# Patient Record
Sex: Female | Born: 1990 | Race: White | Hispanic: No | Marital: Single | Smoking: Former smoker
Health system: Southern US, Community
[De-identification: ages and names within clinical notes are randomized; demographics above are authoritative.]

## PROBLEM LIST (undated history)

## (undated) DIAGNOSIS — K589 Irritable bowel syndrome without diarrhea: Secondary | ICD-10-CM

## (undated) DIAGNOSIS — G43909 Migraine, unspecified, not intractable, without status migrainosus: Secondary | ICD-10-CM

## (undated) HISTORY — PX: LEG SURGERY: SHX1003

## (undated) HISTORY — PX: WISDOM TOOTH EXTRACTION: SHX21

---
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
## 2012-03-16 ENCOUNTER — Emergency Department (INDEPENDENT_AMBULATORY_CARE_PROVIDER_SITE_OTHER): Payer: No Typology Code available for payment source

## 2012-03-16 ENCOUNTER — Encounter (HOSPITAL_BASED_OUTPATIENT_CLINIC_OR_DEPARTMENT_OTHER): Payer: Self-pay | Admitting: Emergency Medicine

## 2012-03-16 ENCOUNTER — Emergency Department (HOSPITAL_BASED_OUTPATIENT_CLINIC_OR_DEPARTMENT_OTHER)
Admission: EM | Admit: 2012-03-16 | Discharge: 2012-03-16 | Disposition: A | Discharge status: Home / Self Care | Payer: No Typology Code available for payment source | Attending: Emergency Medicine | Admitting: Emergency Medicine

## 2012-03-16 DIAGNOSIS — S060X9A Concussion with loss of consciousness of unspecified duration, initial encounter: Secondary | ICD-10-CM | POA: Insufficient documentation

## 2012-03-16 DIAGNOSIS — S0990XA Unspecified injury of head, initial encounter: Secondary | ICD-10-CM

## 2012-03-16 DIAGNOSIS — M549 Dorsalgia, unspecified: Secondary | ICD-10-CM

## 2012-03-16 DIAGNOSIS — M542 Cervicalgia: Secondary | ICD-10-CM

## 2012-03-16 DIAGNOSIS — S161XXA Strain of muscle, fascia and tendon at neck level, initial encounter: Secondary | ICD-10-CM

## 2012-03-16 DIAGNOSIS — S239XXA Sprain of unspecified parts of thorax, initial encounter: Secondary | ICD-10-CM | POA: Insufficient documentation

## 2012-03-16 DIAGNOSIS — IMO0002 Reserved for concepts with insufficient information to code with codable children: Secondary | ICD-10-CM

## 2012-03-16 DIAGNOSIS — S139XXA Sprain of joints and ligaments of unspecified parts of neck, initial encounter: Secondary | ICD-10-CM | POA: Insufficient documentation

## 2012-03-16 DIAGNOSIS — S060XAA Concussion with loss of consciousness status unknown, initial encounter: Secondary | ICD-10-CM | POA: Insufficient documentation

## 2012-03-16 HISTORY — DX: Migraine, unspecified, not intractable, without status migrainosus: G43.909

## 2012-03-16 HISTORY — DX: Irritable bowel syndrome, unspecified: K58.9

## 2012-03-16 MED ORDER — IBUPROFEN 800 MG PO TABS
800.0000 mg | ORAL_TABLET | Freq: Once | ORAL | Status: AC
  Administered 2012-03-16: 800 mg via ORAL

## 2012-03-16 MED ORDER — IBUPROFEN 800 MG PO TABS
ORAL_TABLET | ORAL | Status: AC
  Administered 2012-03-16: 800 mg via ORAL
  Filled 2012-03-16: qty 1

## 2012-03-16 NOTE — Discharge Instructions (Signed)
Back Pain, Adult Low back pain is very common. About 1 in 5 people have back pain.The cause of low back pain is rarely dangerous. The pain often gets better over time.About half of people with a sudden onset of back pain feel better in just 2 weeks. About 8 in 10 people feel better by 6 weeks.  CAUSES Some common causes of back pain include:  Strain of the muscles or ligaments supporting the spine.   Wear and tear (degeneration) of the spinal discs.   Arthritis.   Direct injury to the back.  DIAGNOSIS Most of the time, the direct cause of low back pain is not known.However, back pain can be treated effectively even when the exact cause of the pain is unknown.Answering your caregiver's questions about your overall health and symptoms is one of the most accurate ways to make sure the cause of your pain is not dangerous. If your caregiver needs more information, he or she may order lab work or imaging tests (X-rays or MRIs).However, even if imaging tests show changes in your back, this usually does not require surgery. HOME CARE INSTRUCTIONS For many people, back pain returns.Since low back pain is rarely dangerous, it is often a condition that people can learn to manageon their own.   Remain active. It is stressful on the back to sit or stand in one place. Do not sit, drive, or stand in one place for more than 30 minutes at a time. Take short walks on level surfaces as soon as pain allows.Try to increase the length of time you walk each day.   Do not stay in bed.Resting more than 1 or 2 days can delay your recovery.   Do not avoid exercise or work.Your body is made to move.It is not dangerous to be active, even though your back may hurt.Your back will likely heal faster if you return to being active before your pain is gone.   Pay attention to your body when you bend and lift. Many people have less discomfortwhen lifting if they bend their knees, keep the load close to their  bodies,and avoid twisting. Often, the most comfortable positions are those that put less stress on your recovering back.   Find a comfortable position to sleep. Use a firm mattress and lie on your side with your knees slightly bent. If you lie on your back, put a pillow under your knees.   Only take over-the-counter or prescription medicines as directed by your caregiver. Over-the-counter medicines to reduce pain and inflammation are often the most helpful.Your caregiver may prescribe muscle relaxant drugs.These medicines help dull your pain so you can more quickly return to your normal activities and healthy exercise.   Put ice on the injured area.   Put ice in a plastic bag.   Place a towel between your skin and the bag.   Leave the ice on for 15 to 20 minutes, 3 to 4 times a day for the first 2 to 3 days. After that, ice and heat may be alternated to reduce pain and spasms.   Ask your caregiver about trying back exercises and gentle massage. This may be of some benefit.   Avoid feeling anxious or stressed.Stress increases muscle tension and can worsen back pain.It is important to recognize when you are anxious or stressed and learn ways to manage it.Exercise is a great option.  SEEK MEDICAL CARE IF:  You have pain that is not relieved with rest or medicine.   You have   pain that does not improve in 1 week.   You have new symptoms.   You are generally not feeling well.  SEEK IMMEDIATE MEDICAL CARE IF:   You have pain that radiates from your back into your legs.   You develop new bowel or bladder control problems.   You have unusual weakness or numbness in your arms or legs.   You develop nausea or vomiting.   You develop abdominal pain.   You feel faint.  Document Released: 11/27/2005 Document Revised: 11/16/2011 Document Reviewed: 04/17/2011 [facility] Patient Information 2012 [place], [place].Concussion and Brain Injury A blow or jolt to the head can disrupt the normal  function of the brain. This type of brain injury is often called a "concussion" or a "closed head injury." Concussions are usually not life-threatening. Even so, the effects of a concussion can be serious.  CAUSES  A concussion is caused by a blunt blow to the head. The blow might be direct or indirect as described below.  Direct blow (running into another player during a soccer game, being hit in a fight, or hitting your head on a hard surface).   Indirect blow (when your head moves rapidly and violently back and forth like in a car crash).  SYMPTOMS  The brain is very complex. Every head injury is different. Some symptoms may appear right away. Other symptoms may not show up for days or weeks after the concussion. The signs of concussion can be hard to notice. Early on, problems may be missed by patients, family members, and caregivers. You may look fine even though you are acting or feeling differently.  These symptoms are usually temporary, but may last for days, weeks, or even longer. Symptoms include:  Mild headaches that will not go away.   Having more trouble than usual with:   Remembering things.   Paying attention or concentrating.   Organizing daily tasks.   Making decisions and solving problems.   Slowness in thinking, acting, speaking, or reading.   Getting lost or easily confused.   Feeling tired all the time or lacking energy (fatigue).   Feeling drowsy.   Sleep disturbances.   Sleeping more than usual.   Sleeping less than usual.   Trouble falling asleep.   Trouble sleeping (insomnia).   Loss of balance or feeling lightheaded or dizzy.   Nausea or vomiting.   Numbness or tingling.   Increased sensitivity to:   Sounds.   Lights.   Distractions.  Other symptoms might include:  Vision problems or eyes that tire easily.   Diminished sense of taste or smell.   Ringing in the ears.   Mood changes such as feeling sad, anxious, or listless.    Becoming easily irritated or angry for little or no reason.   Lack of motivation.  DIAGNOSIS  Your caregiver can usually diagnose a concussion or mild brain injury based on your description of your injury and your symptoms.  Your evaluation might include:  A brain scan to look for signs of injury to the brain. Even if the test shows no injury, you may still have a concussion.   Blood tests to be sure other problems are not present.  TREATMENT   People with a concussion need to be examined and evaluated. Most people with concussions are treated in an emergency department, urgent care, or clinic. Some people must stay in the hospital overnight for further treatment.   Your caregiver will send you home with important instructions to follow.  Be sure to carefully follow them.   Tell your caregiver if you are already taking any medicines (prescription, over-the-counter, or natural remedies), or if you are drinking alcohol or taking illegal drugs. Also, talk with your caregiver if you are taking blood thinners (anticoagulants) or aspirin. These drugs may increase your chances of complications. All of this is important information that may affect treatment.   Only take over-the-counter or prescription medicines for pain, discomfort, or fever as directed by your caregiver.  PROGNOSIS  How fast people recover from brain injury varies from person to person. Although most people have a good recovery, how quickly they improve depends on many factors. These factors include how severe their concussion was, what part of the brain was injured, their age, and how healthy they were before the concussion.  Because all head injuries are different, so is recovery. Most people with mild injuries recover fully. Recovery can take time. In general, recovery is slower in older persons. Also, persons who have had a concussion in the past or have other medical problems may find that it takes longer to recover from their  current injury. Anxiety and depression may also make it harder to adjust to the symptoms of brain injury. HOME CARE INSTRUCTIONS  Return to your normal activities slowly, not all at once. You must give your body and brain enough time for recovery.  Get plenty of sleep at night, and rest during the day. Rest helps the brain to heal.   Avoid staying up late at night.   Keep the same bedtime hours on weekends and weekdays.   Take daytime naps or rest breaks when you feel tired.   Limit activities that require a lot of thought or concentration (brain or cognitive rest). This includes:   Homework or job-related work.   Watching TV.   Computer work.   Avoid activities that could lead to a second brain injury, such as contact or recreational sports, until your caregiver says it is okay. Even after your brain injury has healed, you should protect yourself from having another concussion.   Ask your caregiver when you can return to your normal activities such as driving, bicycling, or operating heavy equipment. Your ability to react may be slower after a brain injury.   Talk with your caregiver about when you can return to work or school.   Inform your teachers, school nurse, school counselor, coach, Event organiser, or work Production designer, theatre/television/film about your injury, symptoms, and restrictions. They should be instructed to report:   Increased problems with attention or concentration.   Increased problems remembering or learning new information.   Increased time needed to complete tasks or assignments.   Increased irritability or decreased ability to cope with stress.   Increased symptoms.   Take only those medicines that your caregiver has approved.   Do not drink alcohol until your caregiver says you are well enough to do so. Alcohol and certain other drugs may slow your recovery and can put you at risk of further injury.   If it is harder than usual to remember things, write them down.   If you  are easily distracted, try to do one thing at a time. For example, do not try to watch TV while fixing dinner.   Talk with family members or close friends when making important decisions.   Keep all follow-up appointments. Repeated evaluation of your symptoms is recommended for your recovery.  PREVENTION  Protect your head from future injury. It is  very important to avoid another head or brain injury before you have recovered. In rare cases, another injury has lead to permanent brain damage, brain swelling, or death. Avoid injuries by using:  Seatbelts when riding in a car.   Alcohol only in moderation.   A helmet when biking, skiing, skateboarding, skating, or doing similar activities.   Safety measures in your home.   Remove clutter and tripping hazards from floors and stairways.   Use grab bars in bathrooms and handrails by stairs.   Place non-slip mats on floors and in bathtubs.   Improve lighting in dim areas.  SEEK MEDICAL CARE IF:  A head injury can cause lingering symptoms. You should seek medical care if you have any of the following symptoms for more than 3 weeks after your injury or are planning to return to sports:  Chronic headaches.   Dizziness or balance problems.   Nausea.   Vision problems.   Increased sensitivity to noise or light.   Depression or mood swings.   Anxiety or irritability.   Memory problems.   Difficulty concentrating or paying attention.   Sleep problems.   Feeling tired all the time.  SEEK IMMEDIATE MEDICAL CARE IF:  You have had a blow or jolt to the head and you (or your family or friends) notice:  Severe or worsening headaches.   Weakness (even if only in one hand or one leg or one part of the face), numbness, or decreased coordination.   Repeated vomiting.   Increased sleepiness or passing out.   One black center of the eye (pupil) is larger than the other.   Convulsions (seizures).   Slurred speech.   Increasing  confusion, restlessness, agitation, or irritability.   Lack of ability to recognize people or places.   Neck pain.   Difficulty being awakened.   Unusual behavior changes.   Loss of consciousness.  Older adults with a brain injury may have a higher risk of serious complications such as a blood clot on the brain. Headaches that get worse or an increase in confusion are signs of this complication. If these signs occur, see a caregiver right away. MAKE SURE YOU:   Understand these instructions.   Will watch your condition.   Will get help right away if you are not doing well or get worse.  FOR MORE INFORMATION  Several groups help people with brain injury and their families. They provide information and put people in touch with local resources. These include support groups, rehabilitation services, and a variety of health care professionals. Among these groups, the Brain Injury Association (BIA, www.biausa.org) has a Secretary/administrator that gathers scientific and educational information and works on a national level to help people with brain injury.  Document Released: 02/17/2004 Document Revised: 11/16/2011 Document Reviewed: 07/15/2008 [facility] Patient Information 2012 [place], [place].

## 2012-03-16 NOTE — ED Provider Notes (Signed)
History     CSN: [id number]  Arrival date & time 03/16/12  1222   First MD Initiated Contact with Patient 03/16/12 1318      Chief Complaint  Patient presents with  . Fall  . Head Injury  . Neck Pain  . Back Pain    (Consider location/radiation/quality/duration/timing/severity/associated sxs/prior treatment) Patient is a 21 y.o. female presenting with fall. The history is provided by the patient and a parent.  Fall The accident occurred 1 to 2 hours ago. Incident: While riding a horse. She landed on dirt. There was no blood loss. The point of impact was the head. The pain is present in the head (Neck and upper back). The pain is moderate. She was not ambulatory at the scene. There was no entrapment after the fall. There was no drug use involved in the accident. There was no alcohol use involved in the accident. Associated symptoms include loss of consciousness (possibly). Pertinent negatives include no nausea.   patient does feel like her hands and legs are tingling however she states she can feel everything when touched.  Past Medical History  Diagnosis Date  . IBS (irritable bowel syndrome)   . Migraines     Past Surgical History  Procedure Date  . Leg surgery     cut the bands in legs for increased movement.    History reviewed. No pertinent family history.  History  Substance Use Topics  . Smoking status: Not on file  . Smokeless tobacco: Not on file  . Alcohol Use:     OB History    Grav Para Term Preterm Abortions TAB SAB Ect Mult Living                  Review of Systems  Gastrointestinal: Negative for nausea.  Neurological: Positive for loss of consciousness (possibly).    Allergies  Cinnamon; Doxycycline; Latex; and Sulfa antibiotics  Home Medications   Current Outpatient Rx  Name Route Sig Dispense Refill  . SEASONIQUE PO Oral Take by mouth.    . SERTRALINE HCL 100 MG PO TABS Oral Take 100 mg by mouth daily.    . TOPIRAMATE 100 MG PO TABS Oral  Take 100 mg by mouth 2 (two) times daily.      BP 122/67  Pulse 77  Temp(Src) 99 F (37.2 C) (Oral)  Resp 16  Ht 5\' 3"  (1.6 m)  Wt 180 lb (81.647 kg)  BMI 31.89 kg/m2  SpO2 100%  LMP 01/17/2012  Physical Exam  Nursing note and vitals reviewed. Constitutional: She appears well-developed and well-nourished. No distress.  HENT:  Head: Normocephalic and atraumatic.  Right Ear: External ear normal.  Left Ear: External ear normal.  Eyes: Conjunctivae are normal. Right eye exhibits no discharge. Left eye exhibits no discharge. No scleral icterus.  Neck: Neck supple. No tracheal deviation present.  Cardiovascular: Normal rate, regular rhythm and intact distal pulses.   Pulmonary/Chest: Effort normal and breath sounds normal. No stridor. No respiratory distress. She has no wheezes. She has no rales.  Abdominal: Soft. Bowel sounds are normal. She exhibits no distension. There is no tenderness. There is no rebound and no guarding.  Musculoskeletal: She exhibits no edema and no tenderness.       Cervical back: She exhibits decreased range of motion and tenderness. She exhibits no swelling and no edema.       Thoracic back: She exhibits tenderness and bony tenderness. She exhibits no swelling, no edema and no deformity.  Lumbar back: Normal.  Neurological: She is alert. She has normal strength. No sensory deficit. Cranial nerve deficit:  no gross defecits noted. She exhibits normal muscle tone. She displays no seizure activity. Coordination normal.  Skin: Skin is warm and dry. No rash noted.       No contusions noted  Psychiatric: She has a normal mood and affect.    ED Course  Procedures (including critical care time)  Labs Reviewed - No data to display Dg Thoracic Spine 2 View  03/16/2012  *RADIOLOGY REPORT*  Clinical Data: Fall, head injury, back pain  THORACIC SPINE - 2 VIEW  Comparison: None.  Findings: Three views of thoracic spine submitted.  No acute fracture or subluxation.   Alignment, disc spaces and vertebral height are preserved.  IMPRESSION: No acute fracture or subluxation.  Original Report Authenticated By: Natasha Mead, M.D.   Ct Head Wo Contrast  03/16/2012  *RADIOLOGY REPORT*  Clinical Data:  Fall from horse, neck pain  CT HEAD WITHOUT CONTRAST CT CERVICAL SPINE WITHOUT CONTRAST  Technique:  Multidetector CT imaging of the head and cervical spine was performed following the standard protocol without intravenous contrast.  Multiplanar CT image reconstructions of the cervical spine were also generated.  Comparison:   None  CT HEAD  Findings: No skull fracture is noted.  Paranasal sinuses and mastoid air cells are unremarkable.  No intracranial hemorrhage, mass effect or midline shift.  No acute infarction.  No mass lesion is noted on this unenhanced scan.  No hydrocephalus.  The gray and white matter differentiation is preserved.  IMPRESSION: No acute intracranial abnormality.  CT CERVICAL SPINE  Findings: Axial images of the cervical spine shows no acute fracture or subluxation.  There is no pneumothorax in visualized lung apices.  Computer processed images shows the alignment, disc spaces and vertebral height are preserved.  No prevertebral soft tissue swelling.  Cervical airway is patent.  Coronal images shows no acute fracture or subluxation.  IMPRESSION: No acute fracture or subluxation.  Original Report Authenticated By: Natasha Mead, M.D.   Ct Cervical Spine Wo Contrast  03/16/2012  *RADIOLOGY REPORT*  Clinical Data:  Fall from horse, neck pain  CT HEAD WITHOUT CONTRAST CT CERVICAL SPINE WITHOUT CONTRAST  Technique:  Multidetector CT imaging of the head and cervical spine was performed following the standard protocol without intravenous contrast.  Multiplanar CT image reconstructions of the cervical spine were also generated.  Comparison:   None  CT HEAD  Findings: No skull fracture is noted.  Paranasal sinuses and mastoid air cells are unremarkable.  No intracranial  hemorrhage, mass effect or midline shift.  No acute infarction.  No mass lesion is noted on this unenhanced scan.  No hydrocephalus.  The gray and white matter differentiation is preserved.  IMPRESSION: No acute intracranial abnormality.  CT CERVICAL SPINE  Findings: Axial images of the cervical spine shows no acute fracture or subluxation.  There is no pneumothorax in visualized lung apices.  Computer processed images shows the alignment, disc spaces and vertebral height are preserved.  No prevertebral soft tissue swelling.  Cervical airway is patent.  Coronal images shows no acute fracture or subluxation.  IMPRESSION: No acute fracture or subluxation.  Original Report Authenticated By: Natasha Mead, M.D.     1. Concussion   2. Cervical strain   3. Thoracic sprain and strain       MDM  Patient does not have any evidence of serious injuries based on her x-rays and exam. Suspect  she does have a mild concussion associated with the cervical and thoracic spine strain. Patient does not want any medications for pain. She states she does have some medications for pain well as some muscle relaxants at home that she can take as needed.        Celene Kras, MD 03/16/12 [phone]

## 2012-03-16 NOTE — ED Notes (Signed)
Pt fell off horse, fell onto upper back and neck.  Mother witnessed, stated she immediately went to pt and she spoke to her.  Pt states she felt like she blacked out for a second but was able to hear everything that was said.  Pt c/o pain in neck, upper back and lower back.  Able to move extremeties.  Pt states her legs feel numb but can feel sensation.

## 2013-05-19 ENCOUNTER — Ambulatory Visit (INDEPENDENT_AMBULATORY_CARE_PROVIDER_SITE_OTHER): Payer: BC Managed Care – PPO | Admitting: Sports Medicine

## 2013-05-19 VITALS — BP 126/85 | Ht 63.0 in | Wt 175.0 lb

## 2013-05-19 DIAGNOSIS — S43499A Other sprain of unspecified shoulder joint, initial encounter: Secondary | ICD-10-CM

## 2013-05-19 DIAGNOSIS — S46311A Strain of muscle, fascia and tendon of triceps, right arm, initial encounter: Secondary | ICD-10-CM

## 2013-05-19 DIAGNOSIS — M25521 Pain in right elbow: Secondary | ICD-10-CM

## 2013-05-19 DIAGNOSIS — M25529 Pain in unspecified elbow: Secondary | ICD-10-CM

## 2013-05-19 DIAGNOSIS — S46819A Strain of other muscles, fascia and tendons at shoulder and upper arm level, unspecified arm, initial encounter: Secondary | ICD-10-CM

## 2013-05-19 MED ORDER — NAPROXEN 500 MG PO TABS: ORAL_TABLET | ORAL | Status: AC

## 2013-05-20 NOTE — Progress Notes (Signed)
  Subjective:    Patient ID: Kellie Stephens, female    DOB: 1991-04-20, 22 y.o.   MRN: [id number]  HPI chief complaint: Right elbow pain  22 year old right-hand-dominant female comes in today complaining of one week of right elbow pain. She began to notice some discomfort in the posterior elbow after doing some triceps extensions with dumbbells. A few days later she felt a pop while showering. She felt like her elbow "dislocated". She has not noticed any swelling. She has not had problems with this elbow in the past. Her pain is most noticeable with elbow extension. No associated numbness or tingling. She is here today with her mother.  Medical history and medications are reviewed Surgical history is significant for surgery on both lower legs. This was done in October by Dr. Lajoyce Corners and it sounds like it was some sort of Achilles lengthening procedure. She is still out on medical leave from that surgery. She is allergic to latex, sulfa antibiotics, and doxycycline Does not smoke, no alcohol use    Review of Systems     Objective:   Physical Exam Well-developed, well-nourished. No acute distress. Awake alert and oriented x3. Vital signs are reviewed.  Right elbow: Full range of motion with pain at full extension. No joint effusion. No soft tissue swelling. There is tenderness to palpation at the insertion of the triceps tendon on the olecranon. No palpable defect. Reproducible pain with resisted triceps extension. No tenderness along the lateral or medial elbow. No tenderness over the biceps tendon. Strength is 5/5. Elbow is stable to ligamentous exam. Negative Tinel's over the cubital tunnel. No appreciable popping or catching in the elbow with active motion. Neurovascularly intact distally.  MSK ultrasound of the right elbow: Long and short views of the posterior elbow were obtained. There are hypoechoic changes at the insertion of the triceps tendon onto the olecranon. These changes are seen  both in long and short view. No obvious tear. No spurring. No neovascularization. Findings are consistent with tendon strain.       Assessment & Plan:  1. Right elbow pain secondary to triceps tendon strain, question snapping triceps syndrome  Naprosyn 500 mg twice a day with food for 5 days then when necessary. Ace wrap for compression and she can wean from this as symptoms allow. He will avoid any sort of exercise with triceps for the next 2-3 weeks. Otherwise, resume activity as tolerated and followup for ongoing or recalcitrant issues.

## 2013-12-09 ENCOUNTER — Ambulatory Visit (INDEPENDENT_AMBULATORY_CARE_PROVIDER_SITE_OTHER): Payer: BC Managed Care – PPO | Admitting: Neurology

## 2013-12-09 ENCOUNTER — Encounter: Payer: Self-pay | Admitting: Neurology

## 2013-12-09 ENCOUNTER — Telehealth: Payer: Self-pay | Admitting: Neurology

## 2013-12-09 VITALS — BP 102/70 | HR 72 | Temp 98.2°F | Ht 64.0 in | Wt 177.3 lb

## 2013-12-09 DIAGNOSIS — R51 Headache: Secondary | ICD-10-CM

## 2013-12-09 DIAGNOSIS — H539 Unspecified visual disturbance: Secondary | ICD-10-CM

## 2013-12-09 DIAGNOSIS — R519 Headache, unspecified: Secondary | ICD-10-CM

## 2013-12-09 MED ORDER — PROPRANOLOL HCL 40 MG PO TABS: 40.0000 mg | ORAL_TABLET | Freq: Two times a day (BID) | ORAL | Status: DC

## 2013-12-09 NOTE — Patient Instructions (Addendum)
For migraines. Migraine Recommendations: 1.  Take abortive medication at immediate onset of migraine (Advil) 2.  Limit use of pain relievers to no more than 2 to 3 days out of the week.  These medications include acetaminophen, ibuprofen, triptans and narcotics.  This will help reduce risk of rebound headaches. 3.  Keep a headache diary. 4.  Stay adequately hydrated. 5.  Maintain good sleep hygiene. 6.  Maintain proper stress management. 7.  For preventative therapy, we will start propranolol 40mg  twice daily (1 in morning and 1 at bedtime).  Side effects include low heart rate, lightheadedness and sleepiness.  Call in 4 weeks with update. 8.  We will get an MRI of the brain. 9.  Follow up in 3 months.

## 2013-12-09 NOTE — Progress Notes (Signed)
NEUROLOGY CONSULTATION NOTE  Kellie Stephens MRN: [id number] DOB: [birth year]  Referring provider: Dr. Azucena Cecil Primary care provider: Dr. Azucena Cecil  Reason for consult:  Headache  HISTORY OF PRESENT ILLNESS: Kellie Stephens is a 22 year old right-handed female with history of depression, anxiety and migraine who presents for migraines.  She is accompanied by her mother.  Records and images were personally reviewed where available.   Onset:  Off and on since age 79, but worse for the past year. Locatiion:  Bilateral retro-orbital.  Not positional. Quality:  nonthrobbing pressure Intensity:  8/10 Aura:  For 8 months, began having transient episodes of bilateral blurred vision lasting up to 30 minutes.  Occurs 1-2x a week.  It occurs separate from headache.  Went to eye doctor who said everything looks okay Associated symptoms:  Nausea, photophobia, phonophobia, osmophobia Duration:  1.5 to 2 hours Frequency:  Every other day Triggers/exacerbating factors:  perfume Relieving factors: sleep Activity:  Sleep.  Past abortive therapy:  Maxalt 10mg  (did not like taste), prochlorperazine  Past preventative therapy:  Amitriptyline (dose unknown; weight gain), verapamil (dose unknown; reduced frequency mildly), topiramate (dose unknown; reduced frequency to 2-3x/week, weight gain)  Current abortive therapy:  Advil (effective, takes about once a week), Aleve (not remembers if helpful) Current preventative therapy:  None She does not like taking medication and tries to push through the pain.  Caffeine:  One cup of coffee every other day Sleep hygiene:  Wakes up a couple of times but falls back to sleep.  Rested. Stress/depression:  No depression.  Mild stress but not significant Family history of headache:  Adopted.  No imaging.  PAST MEDICAL HISTORY: Past Medical History  Diagnosis Date  . IBS (irritable bowel syndrome)   . Migraines     PAST SURGICAL HISTORY: Past Surgical History    Procedure Laterality Date  . Leg surgery      cut the bands in legs for increased movement.  . Wisdom tooth extraction      MEDICATIONS: Current Outpatient Prescriptions on File Prior to Visit  Medication Sig Dispense Refill  . Levonorgest-Eth Estrad 91-Day (SEASONIQUE PO) Take by mouth.      . naproxen (NAPROSYN) 500 MG tablet Take 1 tablet by mouth twice daily for 5 days, then take as needed.  40 tablet  0   No current facility-administered medications on file prior to visit.    ALLERGIES: Allergies  Allergen Reactions  . Cinnamon   . Doxycycline   . Latex   . Sulfa Antibiotics     FAMILY HISTORY: Family History  Problem Relation Age of Onset  . Adopted: Yes    SOCIAL HISTORY: History   Social History  . Marital Status: Single    Spouse Name: N/A    Number of Children: N/A  . Years of Education: N/A   Occupational History  . Not on file.   Social History Main Topics  . Smoking status: Former Smoker    Quit date: 12/09/2012  . Smokeless tobacco: Not on file  . Alcohol Use: No  . Drug Use: No  . Sexual Activity: Not on file   Other Topics Concern  . Not on file   Social History Narrative  . No narrative on file    REVIEW OF SYSTEMS: Constitutional: No fevers, chills, or sweats, no generalized fatigue, change in appetite Eyes: No visual changes, double vision, eye pain Ear, nose and throat: No hearing loss, ear pain, nasal congestion, sore throat Cardiovascular: No  chest pain, palpitations Respiratory:  No shortness of breath at rest or with exertion, wheezes GastrointestinaI: No nausea, vomiting, diarrhea, abdominal pain, fecal incontinence Genitourinary:  No dysuria, urinary retention or frequency Musculoskeletal:  No neck pain, back pain Integumentary: No rash, pruritus, skin lesions Neurological: as above Psychiatric: No depression, insomnia, anxiety Endocrine: No palpitations, fatigue, diaphoresis, mood swings, change in appetite, change in  weight, increased thirst Hematologic/Lymphatic:  No anemia, purpura, petechiae. Allergic/Immunologic: no itchy/runny eyes, nasal congestion, recent allergic reactions, rashes  PHYSICAL EXAM: Filed Vitals:   12/09/13 1013  BP: 102/70  Pulse: 72  Temp: 98.2 F (36.8 C)   General: No acute distress Head:  Normocephalic/atraumatic Neck: supple, no paraspinal tenderness, full range of motion Back: No paraspinal tenderness Heart: regular rate and rhythm Lungs: Clear to auscultation bilaterally. Vascular: No carotid bruits. Neurological Exam: Mental status: alert and oriented to person, place, and time, speech fluent and not dysarthric, language intact. Cranial nerves: CN I: not tested CN II: pupils equal, round and reactive to light, visual fields intact, visual acuity 20/20 bilaterally, fundi unremarkable. CN III, IV, VI:  full range of motion, no nystagmus, no ptosis CN V: facial sensation intact CN VII: upper and lower face symmetric CN VIII: hearing intact CN IX, X: gag intact, uvula midline CN XI: sternocleidomastoid and trapezius muscles intact CN XII: tongue midline Bulk & Tone: normal, no fasciculations. Motor: 5/5 throughout Sensation: temperature and vibration intact Deep Tendon Reflexes: 2+ throughout, toes down Finger to nose testing: no dysmetria Heel to shin: no dysmetria Gait: normal stance and stride, able to walk on toes, heels and in tandem. Romberg negative.  IMPRESSION: 1.  Chronic migraine without aura. 2.  Transient blurred vision.  PLAN: 1.  We will start propranolol 40mg  BID for preventative.  Call in 4 weeks with update. 2.  Advil at earliest onset for migraine but not to exceed two days out of week. 3.  MRI of brain with and without contrast.  Transient blurred vision may well be a migraine aura without headache.  However, since this is a new symptom that started over the past several months, I think imaging of the brain is warranted. 4.  Follow up  in 3 months.   Thank you for allowing me to take part in the care of this patient.  Shon Millet, DO  CC:

## 2013-12-09 NOTE — Telephone Encounter (Signed)
Spoke with Dorene Grebe. Appointment for the MRI brain with and without is scheduled at [facility]) on Friday, January 2 at 8:45 am; arrive at 8:15 am. Address and phone number given as well.

## 2013-12-17 ENCOUNTER — Ambulatory Visit (HOSPITAL_COMMUNITY): Payer: BC Managed Care – PPO

## 2013-12-19 ENCOUNTER — Telehealth: Payer: Self-pay | Admitting: Neurology

## 2013-12-19 NOTE — Telephone Encounter (Signed)
Spoke with Dorene Grebeatalie and discussed the MRI results.  Looks okay.  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  She understands.  Contrast would have looked at this better, but they did not give her contrast due to her past severe reaction to sulfa.

## 2014-01-07 ENCOUNTER — Encounter: Payer: Self-pay | Admitting: Neurology

## 2014-03-09 ENCOUNTER — Ambulatory Visit: Payer: BC Managed Care – PPO | Admitting: Neurology

## 2014-03-09 ENCOUNTER — Encounter: Payer: Self-pay | Admitting: Neurology

## 2014-03-09 ENCOUNTER — Ambulatory Visit (INDEPENDENT_AMBULATORY_CARE_PROVIDER_SITE_OTHER): Payer: BC Managed Care – PPO | Admitting: Neurology

## 2014-03-09 VITALS — BP 118/64 | HR 68 | Temp 97.0°F | Resp 16 | Ht 64.0 in | Wt 212.5 lb

## 2014-03-09 DIAGNOSIS — G43709 Chronic migraine without aura, not intractable, without status migrainosus: Secondary | ICD-10-CM

## 2014-03-09 DIAGNOSIS — G43109 Migraine with aura, not intractable, without status migrainosus: Secondary | ICD-10-CM

## 2014-03-09 DIAGNOSIS — IMO0002 Reserved for concepts with insufficient information to code with codable children: Secondary | ICD-10-CM

## 2014-03-09 MED ORDER — PROPRANOLOL HCL 60 MG PO TABS: 60.0000 mg | ORAL_TABLET | Freq: Two times a day (BID) | ORAL | Status: DC

## 2014-03-09 NOTE — Progress Notes (Signed)
NEUROLOGY FOLLOW UP OFFICE NOTE  Kellie Stephens [id number]  HISTORY OF PRESENT ILLNESS: Kellie Stephens is a 23 year old right-handed female with history of depression, anxiety and migraine who follows up for migraines.  She is accompanied by her mother.  Records and images were personally reviewed where available.   UPDATE: 01/07/14 MRI BRAIN WO:  unremarkable.  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  Contrast was not given as she has past severe reaction to sulfa.  Intensity:  6/10 (one was 8/10) Duration:  1 to 2 hours Frequency:  15 headache days per month Current abortive therapy: Advil, Excedrin or Aleve (taken only 3 times in last 3 months) Current preventative therapy:  propranolol 40mg  twice daily  HISTORY Onset:  Off and on since age 75, but worse for the past year. Location:  Bilateral retro-orbital.  Not positional. Quality:  nonthrobbing pressure Intensity:  8/10 Aura:  For 8 months, began having transient episodes of bilateral blurred vision lasting up to 30 minutes, prior to the headache.  No visual obscurations.  Went to eye doctor who said everything looks okay Associated symptoms:  Nausea, photophobia, phonophobia, osmophobia Initial Duration:  1.5 to 2 hours Initial Frequency:  Every other day Triggers/exacerbating factors:  perfume Relieving factors: sleep Activity:  Sleep.  Past abortive therapy:  Maxalt 10mg  (did not like taste), prochlorperazine  Past preventative therapy:  Amitriptyline (dose unknown; weight gain), verapamil (dose unknown; reduced frequency mildly), topiramate (dose unknown; reduced frequency to 2-3x/week, weight gain)  Caffeine:  One cup of coffee every other day Sleep hygiene:  Wakes up a couple of times but falls back to sleep.  Rested. Stress/depression:  No depression.  Mild stress but not significant Family history of headache:  Adopted.  PAST MEDICAL HISTORY: Past Medical History  Diagnosis Date  . IBS  (irritable bowel syndrome)   . Migraines     MEDICATIONS: Current Outpatient Prescriptions on File Prior to Visit  Medication Sig Dispense Refill  . Levonorgest-Eth Estrad 91-Day (SEASONIQUE PO) Take by mouth.      . naproxen (NAPROSYN) 500 MG tablet Take 1 tablet by mouth twice daily for 5 days, then take as needed.  40 tablet  0   No current facility-administered medications on file prior to visit.    ALLERGIES: Allergies  Allergen Reactions  . Cinnamon   . Doxycycline   . Latex   . Sulfa Antibiotics     FAMILY HISTORY: Family History  Problem Relation Age of Onset  . Adopted: Yes  . Ataxia Neg Hx   . Chorea Neg Hx   . Dementia Neg Hx   . Mental retardation Neg Hx   . Migraines Neg Hx   . Multiple sclerosis Neg Hx   . Neurofibromatosis Neg Hx   . Neuropathy Neg Hx   . Parkinsonism Neg Hx   . Seizures Neg Hx   . Stroke Neg Hx     SOCIAL HISTORY: History   Social History  . Marital Status: Single    Spouse Name: N/A    Number of Children: N/A  . Years of Education: N/A   Occupational History  . Not on file.   Social History Main Topics  . Smoking status: Former Smoker    Quit date: 12/09/2012  . Smokeless tobacco: Not on file  . Alcohol Use: No  . Drug Use: No  . Sexual Activity: Not on file   Other Topics Concern  . Not on file   Social History Narrative  .  No narrative on file    REVIEW OF SYSTEMS: Constitutional: No fevers, chills, or sweats, no generalized fatigue, change in appetite Eyes: No visual changes, double vision, eye pain Ear, nose and throat: No hearing loss, ear pain, nasal congestion, sore throat Cardiovascular: No chest pain, palpitations Respiratory:  No shortness of breath at rest or with exertion, wheezes GastrointestinaI: No nausea, vomiting, diarrhea, abdominal pain, fecal incontinence Genitourinary:  No dysuria, urinary retention or frequency Musculoskeletal:  No neck pain, back pain Integumentary: No rash, pruritus,  skin lesions Neurological: as above Psychiatric: No depression, insomnia, anxiety Endocrine: No palpitations, fatigue, diaphoresis, mood swings, change in appetite, change in weight, increased thirst Hematologic/Lymphatic:  No anemia, purpura, petechiae. Allergic/Immunologic: no itchy/runny eyes, nasal congestion, recent allergic reactions, rashes  PHYSICAL EXAM: Filed Vitals:   03/09/14 1324  BP: 118/64  Pulse: 68  Temp: 97 F (36.1 C)  Resp: 16   General: No acute distress Head:  Normocephalic/atraumatic Neck: supple, no paraspinal tenderness, full range of motion Heart:  Regular rate and rhythm Lungs:  Clear to auscultation bilaterally Back: No paraspinal tenderness Neurological Exam: alert and oriented to person, place, and time. Attention span and concentration intact, recent and remote memory intact, fund of knowledge intact.  Speech fluent and not dysarthric, language intact.  CN II-XII intact. Fundoscopic exam unremarkable without vessel changes, exudates, hemorrhages or papilledema.  Bulk and tone normal, muscle strength 5/5 throughout.  Sensation to light touch, temperature and vibration intact.  Deep tendon reflexes 2+ throughout, toes downgoing.  Finger to nose and heel to shin testing intact.  Gait normal, Romberg negative.  IMPRESSION: Chronic migraine with aura.  No evidence for idiopathic intracranial hypertension on my exam  PLAN: 1.  Increase propranolol to 60mg  twice daily.  Call in 4 weeks with update. 2.  Offered to prescribe another triptan, but she refuses.  Has a headache now.  Offered to give her a toradol shot, but she refuses. 3.  Follow up in 2 months.  If propranolol ineffective, will likely suggest Botox  Shon MilletAdam Jaffe, DO  CC:  Merri Brunetteandace Smith, MD

## 2014-03-09 NOTE — Patient Instructions (Signed)
1.  Wrote new prescription for propranolol  Wrote for 60mg  pills.  Take 1 pill twice daily.  Call in 4 weeks and we may increase dose further. 2.  Follow up in 2 months.

## 2014-05-11 ENCOUNTER — Encounter: Payer: Self-pay | Admitting: Neurology

## 2014-05-11 ENCOUNTER — Ambulatory Visit (INDEPENDENT_AMBULATORY_CARE_PROVIDER_SITE_OTHER): Payer: BC Managed Care – PPO | Admitting: Neurology

## 2014-05-11 VITALS — BP 104/60 | HR 68 | Temp 98.4°F | Resp 16 | Ht 64.0 in | Wt 218.6 lb

## 2014-05-11 DIAGNOSIS — G43119 Migraine with aura, intractable, without status migrainosus: Secondary | ICD-10-CM

## 2014-05-11 MED ORDER — SUMATRIPTAN SUCCINATE 100 MG PO TABS: 100.0000 mg | ORAL_TABLET | Freq: Once | ORAL | Status: AC | PRN

## 2014-05-11 MED ORDER — PROPRANOLOL HCL 80 MG PO TABS: 80.0000 mg | ORAL_TABLET | Freq: Two times a day (BID) | ORAL | Status: DC

## 2014-05-11 NOTE — Progress Notes (Signed)
NEUROLOGY FOLLOW UP OFFICE NOTE  Kellie Meuseatalie Gosa [id number]  HISTORY OF PRESENT ILLNESS: Kellie Stephens is a 23 year old right-handed female with history of depression, anxiety and migraine who follows up for migraines.  She is accompanied by her friend.  Records and images were personally reviewed where available.   UPDATE: Intensity:  6/10 Duration:  2-3 hours Frequency:  15 headache days per month Current abortive therapy: Exedrin migraine (takes about 8 days per month) Current preventative therapy:  propranolol 60mg  twice daily  Caffeine:  No  Sleep hygiene:  Wakes up a couple of times but falls back to sleep.  Rested. Stress/depression:  No depression.  Mild stress but not significant Diet:  Tries to eat healthy Exercise:  Walks once a week  HISTORY Onset:  Off and on since age 89, but worse for the past year. Location:  Bilateral retro-orbital.  Not positional. Quality:  nonthrobbing pressure Initial intensity:  8/10; March 2015 6/10 Aura:  For 8 months, began having transient episodes of bilateral blurred vision lasting up to 30 minutes, prior to the headache.  No visual obscurations.  Went to eye doctor who said everything looks okay Associated symptoms:  Nausea, photophobia, phonophobia, osmophobia Initial Duration:  1.5 to 2 hours; March 2015 1 to 2 hours Initial Frequency:  Every other day; March 2015 15 headache days per month. Triggers/exacerbating factors:  perfume Relieving factors: sleep Activity:  Sleep.  Past abortive therapy:  Maxalt 10mg  (did not like taste), prochlorperazine   Past preventative therapy:  Amitriptyline (dose unknown; weight gain), verapamil (dose unknown; reduced frequency mildly), topiramate (dose unknown; reduced frequency to 2-3x/week, weight gain)  01/07/14 MRI BRAIN WO:  unremarkable.  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  Contrast was not given as she has past severe reaction to sulfa.  Family history of headache:   Adopted.  PAST MEDICAL HISTORY: Past Medical History  Diagnosis Date  . IBS (irritable bowel syndrome)   . Migraines     MEDICATIONS: Current Outpatient Prescriptions on File Prior to Visit  Medication Sig Dispense Refill  . Levonorgest-Eth Estrad 91-Day (SEASONIQUE PO) Take by mouth.      . naproxen (NAPROSYN) 500 MG tablet Take 1 tablet by mouth twice daily for 5 days, then take as needed.  40 tablet  0   No current facility-administered medications on file prior to visit.    ALLERGIES: Allergies  Allergen Reactions  . Cinnamon   . Doxycycline   . Latex   . Sulfa Antibiotics     FAMILY HISTORY: Family History  Problem Relation Age of Onset  . Adopted: Yes  . Ataxia Neg Hx   . Chorea Neg Hx   . Dementia Neg Hx   . Mental retardation Neg Hx   . Migraines Neg Hx   . Multiple sclerosis Neg Hx   . Neurofibromatosis Neg Hx   . Neuropathy Neg Hx   . Parkinsonism Neg Hx   . Seizures Neg Hx   . Stroke Neg Hx     SOCIAL HISTORY: History   Social History  . Marital Status: Single    Spouse Name: N/A    Number of Children: N/A  . Years of Education: N/A   Occupational History  . Not on file.   Social History Main Topics  . Smoking status: Former Smoker    Quit date: 12/09/2012  . Smokeless tobacco: Not on file  . Alcohol Use: No  . Drug Use: No  . Sexual Activity: Yes  Partners: Male   Other Topics Concern  . Not on file   Social History Narrative  . No narrative on file    REVIEW OF SYSTEMS: Constitutional: No fevers, chills, or sweats, no generalized fatigue, change in appetite Eyes: No visual changes, double vision, eye pain Ear, nose and throat: No hearing loss, ear pain, nasal congestion, sore throat Cardiovascular: No chest pain, palpitations Respiratory:  No shortness of breath at rest or with exertion, wheezes GastrointestinaI: No nausea, vomiting, diarrhea, abdominal pain, fecal incontinence Genitourinary:  No dysuria, urinary retention  or frequency Musculoskeletal:  No neck pain, back pain Integumentary: No rash, pruritus, skin lesions Neurological: as above Psychiatric: No depression, insomnia, anxiety Endocrine: No palpitations, fatigue, diaphoresis, mood swings, change in appetite, change in weight, increased thirst Hematologic/Lymphatic:  No anemia, purpura, petechiae. Allergic/Immunologic: no itchy/runny eyes, nasal congestion, recent allergic reactions, rashes  PHYSICAL EXAM: Filed Vitals:   05/11/14 1312  BP: 104/60  Pulse: 68  Temp: 98.4 F (36.9 C)  Resp: 16   General: No acute distress Head:  Normocephalic/atraumatic Neck: supple, no paraspinal tenderness, full range of motion Heart:  Regular rate and rhythm Lungs:  Clear to auscultation bilaterally Back: No paraspinal tenderness Neurological Exam: alert and oriented to person, place, and time. Attention span and concentration intact, recent and remote memory intact, fund of knowledge intact.  Speech fluent and not dysarthric, language intact.  CN II-XII intact. Fundoscopic exam unremarkable without vessel changes, exudates, hemorrhages or papilledema.  Bulk and tone normal, muscle strength 5/5 throughout.  Sensation to light touch, temperature and vibration intact.  Deep tendon reflexes 2+ throughout, toes downgoing.  Finger to nose and heel to shin testing intact.  Gait normal, Romberg negative.  IMPRESSION: Chronic migraine with aura  PLAN: 1.  Will increase propranolol to 80mg  twice daily 2.  For abortive therapy, will try sumatriptan 100mg  (if ineffective, will try with NSAID). 3.  Increase exercise 4.  Follow up in 3 months.  Call in 4 weeks with update.  Shon Millet, DO  CC:  Merri Brunette, MD

## 2014-05-11 NOTE — Patient Instructions (Signed)
Migraine Recommendations: 1.  Take sumatriptan 100mg  at immediate onset of migraine.  May repeat in 2 hours if needed.  May also consider taking with naproxen or ibuprofen. 2.  Limit use of pain relievers to no more than 2 days out of the week.  These medications include acetaminophen, ibuprofen, triptans and narcotics.  This will help reduce risk of rebound headaches. 3.  Keep a headache diary. 4.  Stay adequately hydrated. 5.  Increase exercise 6.  Maintain proper stress management. 7.  Increase propranolol to 80mg  twice daily 8.  Call in 4 weeks with update.  Follow up in 3 months.

## 2014-05-27 ENCOUNTER — Ambulatory Visit: Payer: BC Managed Care – PPO | Admitting: Emergency Medicine

## 2014-06-04 ENCOUNTER — Other Ambulatory Visit: Payer: Self-pay | Admitting: *Deleted

## 2014-06-04 ENCOUNTER — Telehealth: Payer: Self-pay | Admitting: *Deleted

## 2014-06-04 DIAGNOSIS — R51 Headache: Secondary | ICD-10-CM

## 2014-06-04 MED ORDER — PROPRANOLOL HCL 80 MG PO TABS: 80.0000 mg | ORAL_TABLET | Freq: Two times a day (BID) | ORAL | Status: AC

## 2014-06-04 NOTE — Telephone Encounter (Signed)
Imitrex and Inderal called into pharmacy patient states refill was never escribed to pharmacy from her June visit . I called and spoke with pharmacy they confirmed that she had not picked up any medications since April .

## 2014-08-11 ENCOUNTER — Ambulatory Visit (INDEPENDENT_AMBULATORY_CARE_PROVIDER_SITE_OTHER): Payer: BC Managed Care – PPO | Admitting: Neurology

## 2014-08-11 ENCOUNTER — Encounter: Payer: Self-pay | Admitting: Neurology

## 2014-08-11 VITALS — BP 98/64 | HR 74 | Temp 98.5°F | Resp 16 | Wt 225.0 lb

## 2014-08-11 DIAGNOSIS — G43109 Migraine with aura, not intractable, without status migrainosus: Secondary | ICD-10-CM

## 2014-08-11 DIAGNOSIS — H538 Other visual disturbances: Secondary | ICD-10-CM

## 2014-08-11 DIAGNOSIS — R55 Syncope and collapse: Secondary | ICD-10-CM

## 2014-08-11 MED ORDER — NORTRIPTYLINE HCL 10 MG PO CAPS: 10.0000 mg | ORAL_CAPSULE | Freq: Every day | ORAL | Status: AC

## 2014-08-11 NOTE — Patient Instructions (Addendum)
1.  Start nortriptyline  at bedtime.  Side effects may include sleepiness, restlessness, nausea, dizziness, dry mouth or weight gain.  Call in 4 weeks with update and we can increase dose if needed. 2.  For the propranolol, take 1 pill at bedtime for 5 days and then stop. 3.  We will refer you to ophthalmologist for eye evaluation 4.  Contact Dr. Katrinka Blazing about these new symptoms and the passing out. 5.  Limit use of excedrin or any pain reliever to no more than 2 days out of the week 6.  Follow up in 6 weeks. 7.  Consider supplements (riboflavin  daily, magnesium citrate  daily).

## 2014-08-11 NOTE — Progress Notes (Addendum)
NEUROLOGY FOLLOW UP OFFICE NOTE  Kellie Stephens [id number]  HISTORY OF PRESENT ILLNESS: Kellie Stephens is a 23 year old right-handed female with history of depression, anxiety and migraine who follows up for migraines.  UPDATE: Headaches were pretty much unchanged up until this past month. Intensity:  8/10 Duration:  1.5 hours after taking excedrin (about 5 days, needs to take sumatriptan) Frequency:  15-20 headache days a month Current abortive therapy: Excedrin (taking over 15 days over the past month), Sumatriptan  (takes occasionally, no more than 5 days per month) Current preventative therapy:  propranolol  twice daily  Over the past two weeks or so, she reports that whenever she closes her eyes, she sees waves that make her nauseous and dizzy, like a spinning sensation.  She also reports more difficulty seeing the board from the back of the room at school.  Last week, she had a syncopal episode.  She was at the [employer].  She was walking through the gate when she felt hot and sweaty.  She had to lean against a pillar.  She had trouble hearing, like she was underwater, and couldn't see (blurred vision).  Her face became white and lips blue.  She then passed out, but she was caught by her boyfriend before she fell to the ground.  She was unconscious for no more than a minute and then she woke up.  There was no postictal confusion.  She did not have convulsions, incontinence or tongue biting.  When she woke up she drank water and gatorade.  She reports that she ate and drank about 30 minutes prior.  Caffeine:  No  Sleep hygiene:  Wakes up a couple of times but falls back to sleep.  Rested. Stress/depression:  No depression.  Mild stress but not significant Diet:  Tries to eat healthy Exercise:  Walks once a week  HISTORY Onset:  Off and on since age 62, but worse for the past year. Location:  Bilateral retro-orbital.  Not positional. Quality:  nonthrobbing  pressure Initial intensity:  8/10; June 2015 6/10 Aura:  For 8 months, began having transient episodes of bilateral blurred vision lasting up to 30 minutes, prior to the headache.  No visual obscurations.  Went to eye doctor who said everything looks okay Associated symptoms:  Nausea, photophobia, phonophobia, osmophobia Initial Duration:  1.5 to 2 hours; June 2015 2-3 hours Initial Frequency:  Every other day; June 2015 15 headache days per month Triggers/exacerbating factors:  perfume Relieving factors: sleep Activity:  Sleep.  Past abortive therapy:  Maxalt  (did not like taste), prochlorperazine    Past preventative therapy:  Amitriptyline (dose unknown; weight gain), verapamil (dose unknown; reduced frequency mildly), topiramate (dose unknown; reduced frequency to 2-3x/week, weight gain)  01/07/14 MRI BRAIN WO:  unremarkable.  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  Contrast was not given as she has past severe reaction to sulfa.  Family history of headache:  Adopted.  PAST MEDICAL HISTORY: Past Medical History  Diagnosis Date  . IBS (irritable bowel syndrome)   . Migraines     MEDICATIONS: Current Outpatient Prescriptions on File Prior to Visit  Medication Sig Dispense Refill  . naproxen (NAPROSYN) 500 MG tablet Take 1 tablet by mouth twice daily for 5 days, then take as needed.  40 tablet  0  . propranolol (INDERAL) 80 MG tablet Take 1 tablet (80 mg total) by mouth 2 (two) times daily.  60 tablet  0  . SUMAtriptan (IMITREX) 100 MG  tablet Take 1 tablet (100 mg total) by mouth once as needed for migraine or headache. May repeat in 2 hours if headache persists or recurs.  9 tablet  2  . Levonorgest-Eth Estrad 91-Day (SEASONIQUE PO) Take by mouth.       No current facility-administered medications on file prior to visit.    ALLERGIES: Allergies  Allergen Reactions  . Cinnamon   . Doxycycline   . Latex   . Sulfa Antibiotics     FAMILY HISTORY: Family  History  Problem Relation Age of Onset  . Adopted: Yes  . Ataxia Neg Hx   . Chorea Neg Hx   . Dementia Neg Hx   . Mental retardation Neg Hx   . Migraines Neg Hx   . Multiple sclerosis Neg Hx   . Neurofibromatosis Neg Hx   . Neuropathy Neg Hx   . Parkinsonism Neg Hx   . Seizures Neg Hx   . Stroke Neg Hx     SOCIAL HISTORY: History   Social History  . Marital Status: Single    Spouse Name: N/A    Number of Children: N/A  . Years of Education: N/A   Occupational History  . Not on file.   Social History Main Topics  . Smoking status: Former Smoker    Quit date: 12/09/2012  . Smokeless tobacco: Not on file  . Alcohol Use: No  . Drug Use: No  . Sexual Activity: Yes    Partners: Male    Birth Control/ Protection: Condom   Other Topics Concern  . Not on file   Social History Narrative  . No narrative on file    REVIEW OF SYSTEMS: Constitutional: No fevers, chills, or sweats, no generalized fatigue, change in appetite Eyes: No visual changes, double vision, eye pain Ear, nose and throat: No hearing loss, ear pain, nasal congestion, sore throat Cardiovascular: No chest pain, palpitations Respiratory:  No shortness of breath at rest or with exertion, wheezes GastrointestinaI: No nausea, vomiting, diarrhea, abdominal pain, fecal incontinence Genitourinary:  No dysuria, urinary retention or frequency Musculoskeletal:  No neck pain, back pain Integumentary: No rash, pruritus, skin lesions Neurological: as above Psychiatric: No depression, insomnia, anxiety Endocrine: No palpitations, fatigue, diaphoresis, mood swings, change in appetite, change in weight, increased thirst Hematologic/Lymphatic:  No anemia, purpura, petechiae. Allergic/Immunologic: no itchy/runny eyes, nasal congestion, recent allergic reactions, rashes  PHYSICAL EXAM: Filed Vitals:   08/11/14 1312  BP: 98/64  Pulse: 74  Temp: 98.5 F (36.9 C)  Resp: 16   General: No acute distress Head:   Normocephalic/atraumatic Neck: supple, no paraspinal tenderness, full range of motion Heart:  Regular rate and rhythm Lungs:  Clear to auscultation bilaterally Back: No paraspinal tenderness Neurological Exam: alert and oriented to person, place, and time. Attention span and concentration intact, recent and remote memory intact, fund of knowledge intact.  Speech fluent and not dysarthric, language intact.  CN II-XII intact. Fundi not visualized.  Bulk and tone normal, muscle strength 5/5 throughout.  Sensation to light touch, temperature and vibration intact.  Deep tendon reflexes 2+ throughout, toes downgoing.  Finger to nose and heel to shin testing intact.  Gait normal, Romberg negative.  IMPRESSION: Chronic migraine with aura Syncope Vision changes (trouble seeing the board in school)  PLAN: 1.  Due to dizziness and episode of syncope, will discontinue propranolol and start nortriptyline  at bedtime.  Side effects discussed. 2.  Referral to ophthalmologist for formal eye evaluation and evaluation for papilledema 3.  Limit use of Excedrin or sumatriptan to no more than 2 days out of the week 4.  Follow up in 6 weeks.  Shon Millet, DO  CC:  Merri Brunette, MD

## 2014-08-18 ENCOUNTER — Telehealth: Payer: Self-pay | Admitting: Neurology

## 2014-08-18 NOTE — Telephone Encounter (Signed)
Pt called f/u on the referral Dr. Everlena Cooper was going to send out for her to see an eye doctor.  Please call pt. C/B [phone]

## 2014-08-18 NOTE — Telephone Encounter (Signed)
Patient was called and she was notified that you would get back in touch with her when you return to the office

## 2014-08-19 NOTE — Telephone Encounter (Signed)
Left message for patient to contact office she has appt with Dr Nelle Don for  Formal eye exam  08/24/14 9am  [address]

## 2014-08-22 ENCOUNTER — Emergency Department (HOSPITAL_BASED_OUTPATIENT_CLINIC_OR_DEPARTMENT_OTHER)
Admission: EM | Admit: 2014-08-22 | Discharge: 2014-08-23 | Disposition: A | Discharge status: Home / Self Care | Payer: BC Managed Care – PPO | Attending: Emergency Medicine | Admitting: Emergency Medicine

## 2014-08-22 ENCOUNTER — Emergency Department (HOSPITAL_BASED_OUTPATIENT_CLINIC_OR_DEPARTMENT_OTHER): Payer: BC Managed Care – PPO

## 2014-08-22 ENCOUNTER — Encounter (HOSPITAL_BASED_OUTPATIENT_CLINIC_OR_DEPARTMENT_OTHER): Payer: Self-pay | Admitting: Emergency Medicine

## 2014-08-22 DIAGNOSIS — X503XXA Overexertion from repetitive movements, initial encounter: Secondary | ICD-10-CM | POA: Insufficient documentation

## 2014-08-22 DIAGNOSIS — Z791 Long term (current) use of non-steroidal anti-inflammatories (NSAID): Secondary | ICD-10-CM | POA: Diagnosis not present

## 2014-08-22 DIAGNOSIS — Y9389 Activity, other specified: Secondary | ICD-10-CM | POA: Diagnosis not present

## 2014-08-22 DIAGNOSIS — Y929 Unspecified place or not applicable: Secondary | ICD-10-CM | POA: Insufficient documentation

## 2014-08-22 DIAGNOSIS — S6990XA Unspecified injury of unspecified wrist, hand and finger(s), initial encounter: Secondary | ICD-10-CM

## 2014-08-22 DIAGNOSIS — S59919A Unspecified injury of unspecified forearm, initial encounter: Secondary | ICD-10-CM

## 2014-08-22 DIAGNOSIS — K589 Irritable bowel syndrome without diarrhea: Secondary | ICD-10-CM | POA: Insufficient documentation

## 2014-08-22 DIAGNOSIS — X500XXA Overexertion from strenuous movement or load, initial encounter: Secondary | ICD-10-CM | POA: Insufficient documentation

## 2014-08-22 DIAGNOSIS — Z87891 Personal history of nicotine dependence: Secondary | ICD-10-CM | POA: Diagnosis not present

## 2014-08-22 DIAGNOSIS — IMO0002 Reserved for concepts with insufficient information to code with codable children: Secondary | ICD-10-CM | POA: Insufficient documentation

## 2014-08-22 DIAGNOSIS — Z79899 Other long term (current) drug therapy: Secondary | ICD-10-CM | POA: Diagnosis not present

## 2014-08-22 DIAGNOSIS — S59909A Unspecified injury of unspecified elbow, initial encounter: Secondary | ICD-10-CM | POA: Insufficient documentation

## 2014-08-22 DIAGNOSIS — Z91040 Latex allergy status: Secondary | ICD-10-CM | POA: Diagnosis not present

## 2014-08-22 DIAGNOSIS — S53401A Unspecified sprain of right elbow, initial encounter: Secondary | ICD-10-CM

## 2014-08-22 DIAGNOSIS — G43909 Migraine, unspecified, not intractable, without status migrainosus: Secondary | ICD-10-CM | POA: Diagnosis not present

## 2014-08-22 MED ORDER — IBUPROFEN 800 MG PO TABS: 800.0000 mg | ORAL_TABLET | Freq: Three times a day (TID) | ORAL | Status: AC

## 2014-08-22 NOTE — ED Provider Notes (Signed)
CSN: [id number]     Arrival date & time 08/22/14  2104 History   First MD Initiated Contact with Patient 08/22/14 2320     Chief Complaint  Patient presents with  . Elbow Pain     (Consider location/radiation/quality/duration/timing/severity/associated sxs/prior Treatment) Patient is a 23 y.o. female presenting with arm injury. The history is provided by the patient. No language interpreter was used.  Arm Injury Location:  Arm Injury: no   Arm location:  R arm Pain details:    Quality:  Aching   Radiates to:  Does not radiate   Severity:  No pain   Onset quality:  Gradual   Timing:  Constant   Progression:  Worsening Chronicity:  New Dislocation: no   Foreign body present:  No foreign bodies Relieved by:  Nothing Worsened by:  Nothing tried Ineffective treatments:  None tried Risk factors: no concern for non-accidental trauma     Past Medical History  Diagnosis Date  . IBS (irritable bowel syndrome)   . Migraines    Past Surgical History  Procedure Laterality Date  . Leg surgery      cut the bands in legs for increased movement.  . Wisdom tooth extraction     Family History  Problem Relation Age of Onset  . Adopted: Yes  . Ataxia Neg Hx   . Chorea Neg Hx   . Dementia Neg Hx   . Mental retardation Neg Hx   . Migraines Neg Hx   . Multiple sclerosis Neg Hx   . Neurofibromatosis Neg Hx   . Neuropathy Neg Hx   . Parkinsonism Neg Hx   . Seizures Neg Hx   . Stroke Neg Hx    History  Substance Use Topics  . Smoking status: Former Smoker    Quit date: 12/09/2012  . Smokeless tobacco: Not on file  . Alcohol Use: No   OB History   Grav Para Term Preterm Abortions TAB SAB Ect Mult Living                 Review of Systems  All other systems reviewed and are negative.     Allergies  Cinnamon; Doxycycline; Latex; and Sulfa antibiotics  Home Medications   Prior to Admission medications   Medication Sig Start Date End Date Taking? Authorizing Provider   ibuprofen (ADVIL,MOTRIN) 800 MG tablet Take 1 tablet (800 mg total) by mouth 3 (three) times daily. 08/22/14   Elson Areas, PA-C  Levonorgest-Eth Estrad 91-Day (SEASONIQUE PO) Take by mouth.    Historical Provider, MD  naproxen (NAPROSYN) 500 MG tablet Take 1 tablet by mouth twice daily for 5 days, then take as needed. 05/19/13   Ozzie Hoyle Draper, DO  nortriptyline (PAMELOR) 10 MG capsule Take 1 capsule (10 mg total) by mouth at bedtime. 08/11/14   Adam Gus Rankin, DO  propranolol (INDERAL) 80 MG tablet Take 1 tablet (80 mg total) by mouth 2 (two) times daily. 06/04/14   Adam Gus Rankin, DO  SUMAtriptan (IMITREX) 100 MG tablet Take 1 tablet (100 mg total) by mouth once as needed for migraine or headache. May repeat in 2 hours if headache persists or recurs. 05/11/14   Adam Gus Rankin, DO   BP 129/81  Pulse 69  Temp(Src) 98.7 F (37.1 C)  Resp 20  Ht  (1.626 m)  Wt 215 lb (97.523 kg)  BMI 36.89 kg/m2  SpO2 99%  LMP 07/21/2014 Physical Exam  Nursing note and vitals reviewed. Constitutional: She  is oriented to person, place, and time. She appears well-developed and well-nourished.  Musculoskeletal: She exhibits tenderness.  Swollen right elbow,  Tender to palpation nv and ns intact  Neurological: She is alert and oriented to person, place, and time. She has normal reflexes.  Skin: Skin is warm.  Psychiatric: She has a normal mood and affect.    ED Course  Procedures (including critical care time) Labs Review Labs Reviewed - No data to display  Imaging Review Dg Elbow Complete Right  08/22/2014   CLINICAL DATA:  Pain in right elbow  EXAM: RIGHT ELBOW - COMPLETE 3+ VIEW  COMPARISON:  None.  FINDINGS: There is no evidence of fracture, dislocation, or joint effusion. There is no evidence of arthropathy or other focal bone abnormality. Soft tissues are unremarkable.  IMPRESSION: Negative.   Electronically Signed   By: Signa Kell M.D.   On: 08/22/2014 21:40     EKG  Interpretation None      MDM   Final diagnoses:  Elbow sprain, right, initial encounter    Sling Ibuprofen Follow up with Dr. Pearletha Forge for recheck in 1 week    Elson Areas, PA-C 08/22/14 2338

## 2014-08-22 NOTE — Discharge Instructions (Signed)
Sprain °A sprain is a tear in one of the strong, fibrous tissues that connect your bones (ligaments). The severity of the sprain depends on how much of the ligament is torn. The tear can be either partial or complete. °CAUSES  °Often, sprains are a result of a fall or an injury. The force of the impact causes the fibers of your ligament to stretch beyond their normal length. This excess tension causes the fibers of your ligament to tear. °SYMPTOMS  °You may have some loss of motion or increased pain within your normal range of motion. Other symptoms include: °· Bruising. °· Tenderness. °· Swelling. °DIAGNOSIS  °In order to diagnose a sprain, your caregiver will physically examine you to determine how torn the ligament is. Your caregiver may also suggest an X-ray exam to make sure no bones are broken. °TREATMENT  °If your ligament is only partially torn, treatment usually involves keeping the injured area in a fixed position (immobilization) for a short period. To do this, your caregiver will apply a bandage, cast, or splint to keep the area from moving until it heals. For a partially torn ligament, the healing process usually takes 2 to 3 weeks. °If your ligament is completely torn, you may need surgery to reconnect the ligament to the bone or to reconstruct the ligament. After surgery, a cast or splint may be applied and will need to stay on for 4 to 6 weeks while your ligament heals. °HOME CARE INSTRUCTIONS °· Keep the injured area elevated to decrease swelling. °· To ease pain and swelling, apply ice to your joint twice a day, for 2 to 3 days. °¨ Put ice in a plastic bag. °¨ Place a towel between your skin and the bag. °¨ Leave the ice on for 15 minutes. °· Only take over-the-counter or prescription medicine for pain as directed by your caregiver. °· Do not leave the injured area unprotected until pain and stiffness go away (usually 3 to 4 weeks). °· Do not allow your cast or splint to get wet. Cover your cast or  splint with a plastic bag when you shower or bathe. Do not swim. °· Your caregiver may suggest exercises for you to do during your recovery to prevent or limit permanent stiffness. °SEEK IMMEDIATE MEDICAL CARE IF: °· Your cast or splint becomes damaged. °· Your pain becomes worse. °MAKE SURE YOU: °· Understand these instructions. °· Will watch your condition. °· Will get help right away if you are not doing well or get worse. °Document Released: 11/24/2000 Document Revised: 02/19/2012 Document Reviewed: 12/09/2011 °[employer]® Patient Information ©2015 [employer]. This information is not intended to replace advice given to you by your health care provider. Make sure you discuss any questions you have with your health care provider. ° °

## 2014-08-22 NOTE — ED Notes (Signed)
Pt reports picked up heavy bag and had pain to elbow pain. Spoke to PCP and advised to come to ED.

## 2014-08-31 NOTE — ED Provider Notes (Signed)
Medical screening examination/treatment/procedure(s) were performed by non-physician practitioner and as supervising physician I was immediately available for consultation/collaboration.   EKG Interpretation None        Rolland Porter, MD 08/31/14 [phone]

## 2014-09-09 NOTE — Telephone Encounter (Signed)
Pt need to talk to someone about eye appt she never got the message due to the wrong phone number taken down her correct number is [phone]

## 2014-09-09 NOTE — Telephone Encounter (Signed)
New appt was made for Dr Nelle DonHollander 09/14/14 10:15 patient is aware

## 2014-09-14 ENCOUNTER — Telehealth: Payer: Self-pay | Admitting: Neurology

## 2014-09-14 NOTE — Telephone Encounter (Signed)
I called patient's Mother Cindy to Arline Asplet her know Dr Nelle DonHollander was the Dr she has eye appt with

## 2014-09-14 NOTE — Telephone Encounter (Signed)
Arline Aspindy, Pt's mother called requesting the contact info for the eye doc that Dr. Everlena CooperJaffe referred the pt to. Pt needs to cancel/r/s the appt with the eye doc. Please call Arline AspCindy # [phone]

## 2014-09-24 ENCOUNTER — Ambulatory Visit (INDEPENDENT_AMBULATORY_CARE_PROVIDER_SITE_OTHER): Payer: BC Managed Care – PPO | Admitting: Neurology

## 2014-09-24 ENCOUNTER — Encounter: Payer: Self-pay | Admitting: Neurology

## 2014-09-24 DIAGNOSIS — R55 Syncope and collapse: Secondary | ICD-10-CM

## 2014-09-24 DIAGNOSIS — G43109 Migraine with aura, not intractable, without status migrainosus: Secondary | ICD-10-CM

## 2014-09-24 DIAGNOSIS — R42 Dizziness and giddiness: Secondary | ICD-10-CM

## 2014-09-24 NOTE — Progress Notes (Signed)
NEUROLOGY FOLLOW UP OFFICE NOTE  Kellie Stephens [id number]  HISTORY OF PRESENT ILLNESS: Kellie Stephens is a 23 year old right-handed female with history of depression, anxiety and migraine who follows up for migraines.  UPDATE: Due to lightheadedness and syncope, propranolol was discontinued.  She was instead prescribed nortriptyline 10mg .  She was also advised to get an eye exam to evaluate for papilledema, however she didn't keep the appointment.  For the past 2 weeks, she reports fever, ranging from 99 to 102.  She saw her PCP last week.  Blood work and EKG were unremarkable.  She really does not wish to take any medication right now.  She continues to feel lightheaded, but has not passed out again.  One time, she reports grabbing for the door handle and missing it, like her depth perception was off.  HISTORY Onset:  Off and on since age 64, but worse for the past year. Location:  Bilateral retro-orbital.  Not positional. Quality:  nonthrobbing pressure Initial intensity:  8/10; June 2015 6/10 Aura:  For 8 months, began having transient episodes of bilateral blurred vision lasting up to 30 minutes, prior to the headache.  No visual obscurations.  Went to eye doctor who said everything looks okay Associated symptoms:  Nausea, photophobia, phonophobia, osmophobia Initial Duration:  1.5 to 2 hours; June 2015 2-3 hours Initial Frequency:  Every other day; June 2015 15 headache days per month Triggers/exacerbating factors:  perfume Relieving factors: sleep Activity:  Sleep.  Past abortive therapy:  Maxalt 10mg  (did not like taste), prochlorperazine     Past preventative therapy:  Amitriptyline (dose unknown; weight gain), verapamil (dose unknown; reduced frequency mildly), topiramate (dose unknown; reduced frequency to 2-3x/week, weight gain)  01/07/14 MRI BRAIN WO:  unremarkable.  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  Contrast was not given as she has past severe  reaction to sulfa.  Over the summer, she reports that whenever she closes her eyes, she sees waves that make her nauseous and dizzy, like a spinning sensation.  She also reports more difficulty seeing the board from the back of the room at school.  Last week, she had a syncopal episode.  She was at the [employer].  She was walking through the gate when she felt hot and sweaty.  She had to lean against a pillar.  She had trouble hearing, like she was underwater, and couldn't see (blurred vision).  Her face became white and lips blue.  She then passed out, but she was caught by her boyfriend before she fell to the ground.  She was unconscious for no more than a minute and then she woke up.  There was no postictal confusion.  She did not have convulsions, incontinence or tongue biting.  When she woke up she drank water and gatorade.  She reports that she ate and drank about 30 minutes prior.  Family history of headache:  Adopted.  PAST MEDICAL HISTORY: Past Medical History  Diagnosis Date  . IBS (irritable bowel syndrome)   . Migraines     MEDICATIONS: Current Outpatient Prescriptions on File Prior to Visit  Medication Sig Dispense Refill  . ibuprofen (ADVIL,MOTRIN) 800 MG tablet Take 1 tablet (800 mg total) by mouth 3 (three) times daily.  21 tablet  0  . Levonorgest-Eth Estrad 91-Day (SEASONIQUE PO) Take by mouth.      . naproxen (NAPROSYN) 500 MG tablet Take 1 tablet by mouth twice daily for 5 days, then take as needed.  40  tablet  0  . propranolol (INDERAL) 80 MG tablet Take 1 tablet (80 mg total) by mouth 2 (two) times daily.  60 tablet  0  . nortriptyline (PAMELOR) 10 MG capsule Take 1 capsule (10 mg total) by mouth at bedtime.  30 capsule  0  . SUMAtriptan (IMITREX) 100 MG tablet Take 1 tablet (100 mg total) by mouth once as needed for migraine or headache. May repeat in 2 hours if headache persists or recurs.  9 tablet  2   No current facility-administered medications on file prior to  visit.    ALLERGIES: Allergies  Allergen Reactions  . Cinnamon   . Doxycycline   . Latex   . Sulfa Antibiotics     FAMILY HISTORY: Family History  Problem Relation Age of Onset  . Adopted: Yes  . Ataxia Neg Hx   . Chorea Neg Hx   . Dementia Neg Hx   . Mental retardation Neg Hx   . Migraines Neg Hx   . Multiple sclerosis Neg Hx   . Neurofibromatosis Neg Hx   . Neuropathy Neg Hx   . Parkinsonism Neg Hx   . Seizures Neg Hx   . Stroke Neg Hx     SOCIAL HISTORY: History   Social History  . Marital Status: Single    Spouse Name: N/A    Number of Children: N/A  . Years of Education: N/A   Occupational History  . Not on file.   Social History Main Topics  . Smoking status: Former Smoker    Quit date: 12/09/2012  . Smokeless tobacco: Not on file  . Alcohol Use: No  . Drug Use: No  . Sexual Activity: Yes    Partners: Male    Birth Control/ Protection: Condom   Other Topics Concern  . Not on file   Social History Narrative  . No narrative on file    REVIEW OF SYSTEMS: Constitutional: Fever Eyes: Visual changes Ear, nose and throat: No hearing loss, ear pain, nasal congestion, sore throat Cardiovascular: No chest pain, palpitations Respiratory:  No shortness of breath at rest or with exertion, wheezes GastrointestinaI: No nausea, vomiting, diarrhea, abdominal pain, fecal incontinence Genitourinary:  No dysuria, urinary retention or frequency Musculoskeletal:  No neck pain, back pain Integumentary: No rash, pruritus, skin lesions Neurological: as above Psychiatric: No depression, insomnia, anxiety Endocrine: No palpitations, fatigue, diaphoresis, mood swings, change in appetite, change in weight, increased thirst Hematologic/Lymphatic:  No anemia, purpura, petechiae. Allergic/Immunologic: no itchy/runny eyes, nasal congestion, recent allergic reactions, rashes  PHYSICAL EXAM: There were no vitals filed for this visit. General: No acute distress Head:   Normocephalic/atraumatic Neck: supple, no paraspinal tenderness, full range of motion Heart:  Regular rate and rhythm Lungs:  Clear to auscultation bilaterally Back: No paraspinal tenderness Neurological Exam: alert and oriented to person, place, and time. Attention span and concentration intact, recent and remote memory intact, fund of knowledge intact.  Speech fluent and not dysarthric, language intact.  CN II-XII intact. Fundoscopic exam unremarkable without vessel changes, exudates, hemorrhages or papilledema.  Bulk and tone normal, muscle strength 5/5 throughout.  Sensation to light touch, temperature and vibration intact.  Deep tendon reflexes 2+ throughout, toes downgoing.  Finger to nose and heel to shin testing intact.  Gait normal, Romberg negative.  IMPRESSION: Chronic migraine with aura Syncope  PLAN: As discussed, I want her to see an ophthalmologist for eye examination, evaluate for papilledema We may need to consider getting another MRI of the brain.  Stop the nortriptyline for now.   Follow up in 3 months.  Call with questions or concerns.  Shon MilletAdam Jaffe, DO  CC: Laurann Montanaynthia White, MD

## 2014-09-24 NOTE — Patient Instructions (Signed)
As discussed, I want you to see an ophthalmologist for eye examination.  I want to look for evidence of increased pressure in the head. We may need to consider getting another MRI of the brain. Stop the nortriptyline for now.   Follow up in 3 months.  Call with questions or concerns.

## 2014-11-10 ENCOUNTER — Encounter: Payer: Self-pay | Admitting: *Deleted

## 2014-11-12 ENCOUNTER — Ambulatory Visit: Payer: BC Managed Care – PPO | Admitting: Cardiology

## 2015-12-07 IMAGING — CR DG ELBOW COMPLETE 3+V*R*
4 series · 4 of 4 positions shown · non-contrast
Comparison: None.

CLINICAL DATA: Pain in right elbow

EXAM:
RIGHT ELBOW - COMPLETE 3+ VIEW

[x elbow joint ap right]
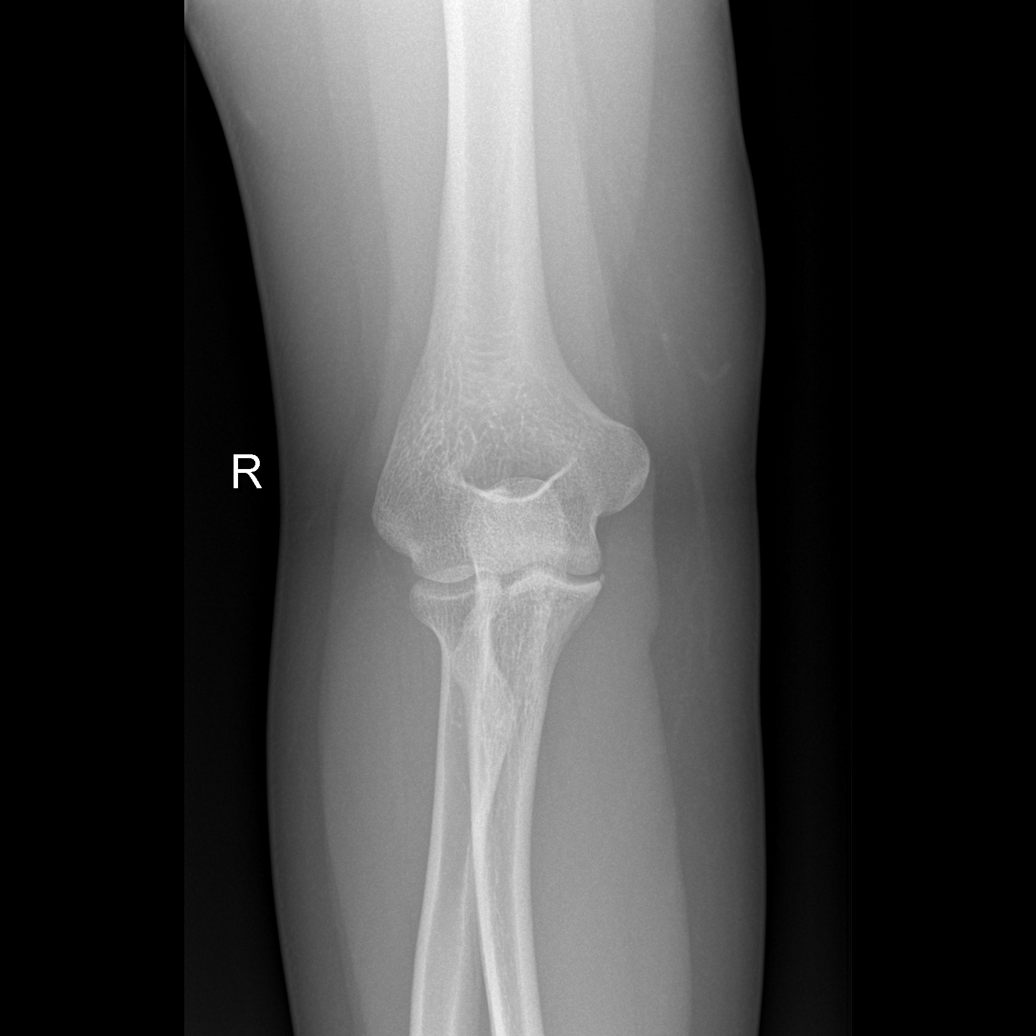

[x elbow joint obl. right (1 of 2)]
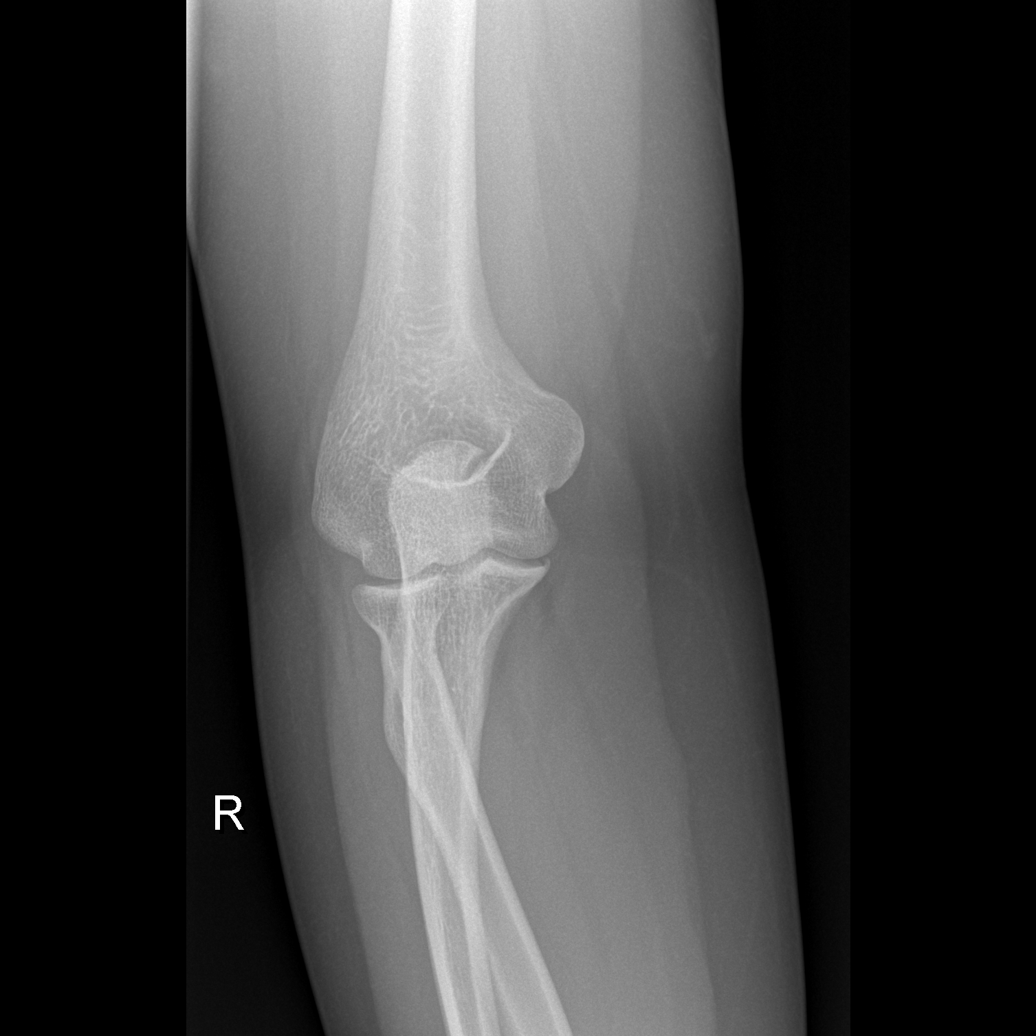

[x elbow joint obl. right (2 of 2)]
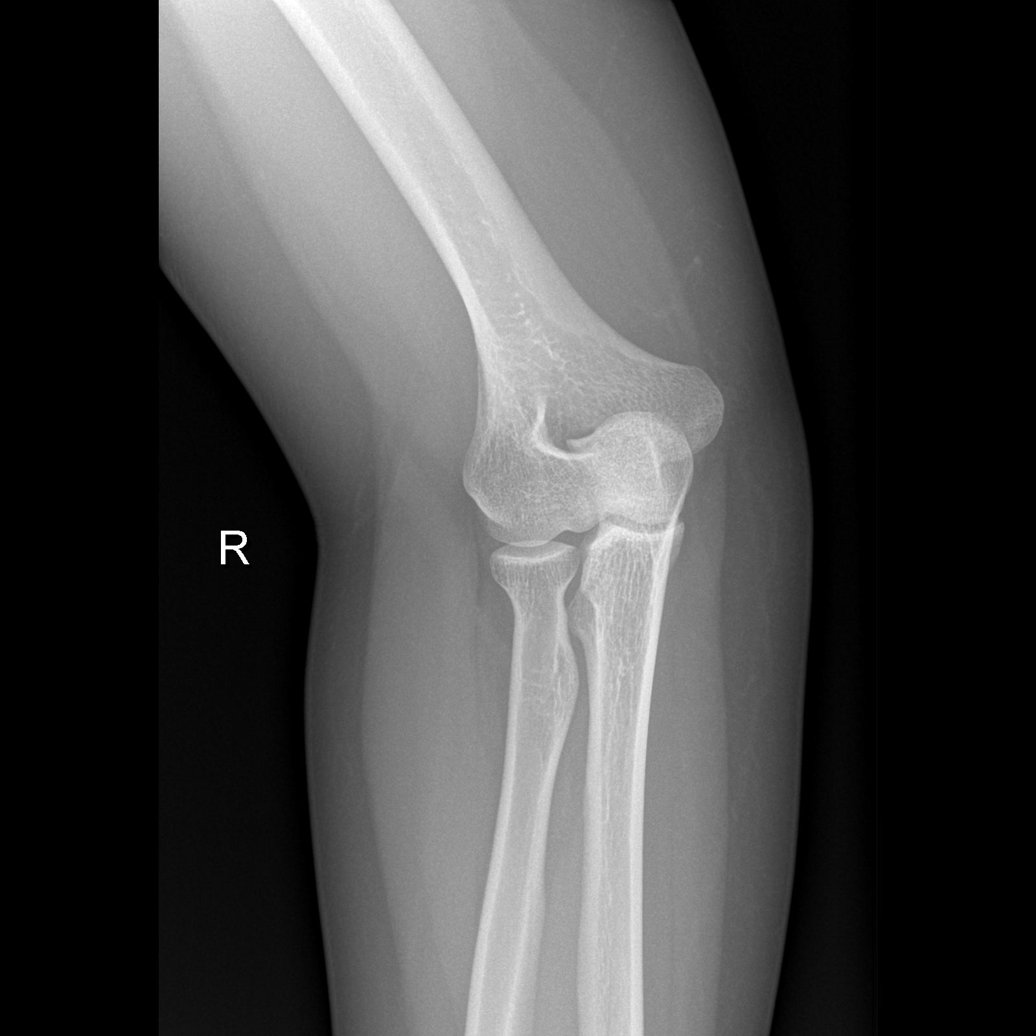

[x elbow joint lat right]
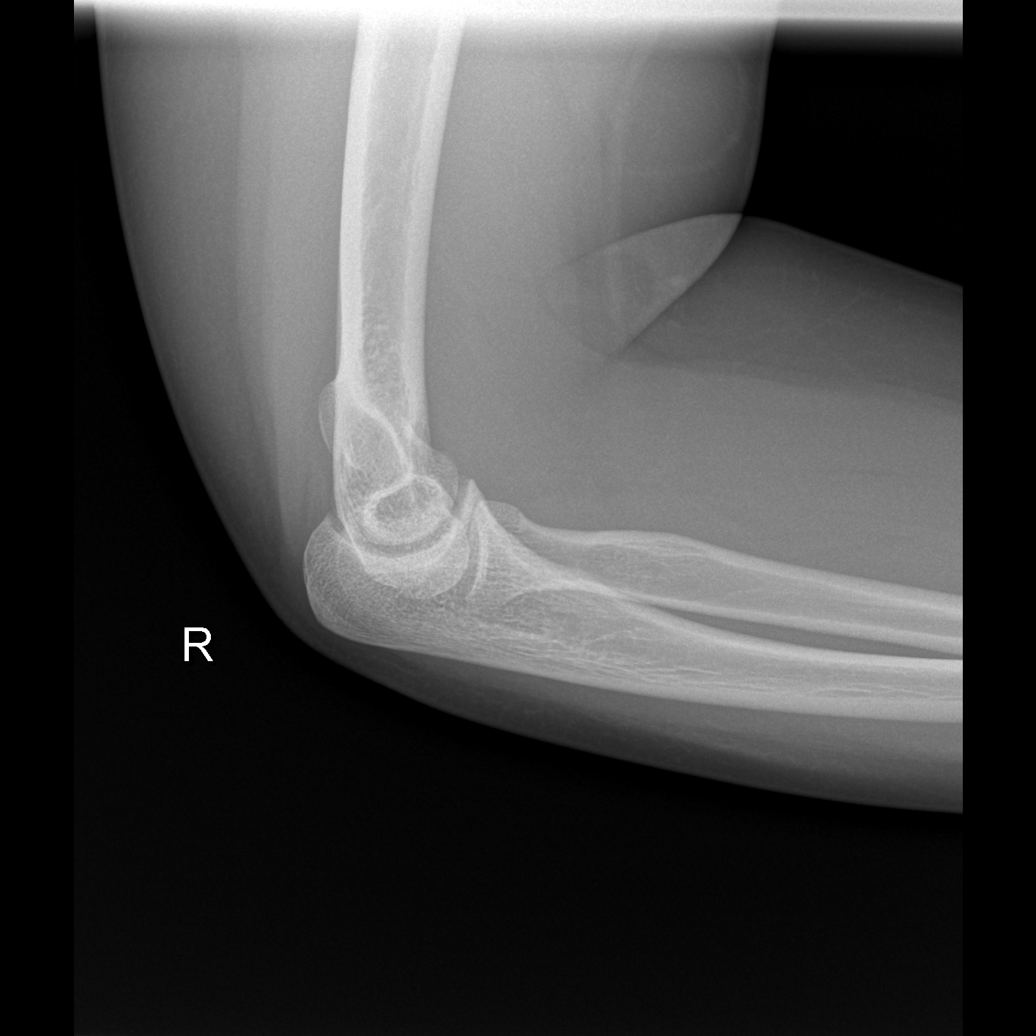

[4 of 4 positions shown; findings below may reference images not displayed]

FINDINGS: There is no evidence of fracture, dislocation, or joint effusion.
There is no evidence of arthropathy or other focal bone abnormality.
Soft tissues are unremarkable.
IMPRESSION: Negative.
